# Patient Record
Sex: Female | Born: 1970 | Race: White | Hispanic: No | Marital: Married | Smoking: Never smoker
Health system: Southern US, Community
[De-identification: ages and names within clinical notes are randomized; demographics above are authoritative.]

## PROBLEM LIST (undated history)

## (undated) DIAGNOSIS — E119 Type 2 diabetes mellitus without complications: Secondary | ICD-10-CM

## (undated) HISTORY — DX: Type 2 diabetes mellitus without complications: E11.9

---

## 2001-03-27 ENCOUNTER — Other Ambulatory Visit: Admission: RE | Admit: 2001-03-27 | Discharge: 2001-03-27 | Payer: Self-pay | Admitting: Obstetrics and Gynecology

## 2002-03-30 ENCOUNTER — Other Ambulatory Visit: Admission: RE | Admit: 2002-03-30 | Discharge: 2002-03-30 | Payer: Self-pay | Admitting: Obstetrics and Gynecology

## 2002-08-25 ENCOUNTER — Encounter: Admission: RE | Admit: 2002-08-25 | Discharge: 2002-11-23 | Payer: Self-pay | Admitting: Obstetrics and Gynecology

## 2002-12-29 ENCOUNTER — Encounter: Payer: Self-pay | Admitting: Obstetrics and Gynecology

## 2002-12-29 ENCOUNTER — Ambulatory Visit (HOSPITAL_COMMUNITY): Admission: RE | Admit: 2002-12-29 | Discharge: 2002-12-29 | Payer: Self-pay | Admitting: Obstetrics and Gynecology

## 2003-03-11 ENCOUNTER — Inpatient Hospital Stay (HOSPITAL_COMMUNITY): Admission: AD | Admit: 2003-03-11 | Discharge: 2003-03-13 | Payer: Self-pay | Admitting: Obstetrics and Gynecology

## 2003-04-22 ENCOUNTER — Other Ambulatory Visit: Admission: RE | Admit: 2003-04-22 | Discharge: 2003-04-22 | Payer: Self-pay | Admitting: Obstetrics and Gynecology

## 2004-07-20 ENCOUNTER — Other Ambulatory Visit: Admission: RE | Admit: 2004-07-20 | Discharge: 2004-07-20 | Payer: Self-pay | Admitting: Obstetrics and Gynecology

## 2004-09-28 ENCOUNTER — Encounter: Admission: RE | Admit: 2004-09-28 | Discharge: 2004-09-28 | Payer: Self-pay | Admitting: Family Medicine

## 2005-09-14 ENCOUNTER — Other Ambulatory Visit: Admission: RE | Admit: 2005-09-14 | Discharge: 2005-09-14 | Payer: Self-pay | Admitting: Obstetrics and Gynecology

## 2006-10-21 ENCOUNTER — Ambulatory Visit (HOSPITAL_BASED_OUTPATIENT_CLINIC_OR_DEPARTMENT_OTHER): Admission: RE | Admit: 2006-10-21 | Discharge: 2006-10-21 | Payer: Self-pay | Admitting: Surgery

## 2012-07-02 ENCOUNTER — Other Ambulatory Visit: Payer: Self-pay | Admitting: Dermatology

## 2012-07-09 ENCOUNTER — Other Ambulatory Visit: Payer: Self-pay | Admitting: Obstetrics and Gynecology

## 2013-01-12 ENCOUNTER — Other Ambulatory Visit: Payer: Self-pay | Admitting: Dermatology

## 2013-07-23 ENCOUNTER — Other Ambulatory Visit: Payer: Self-pay | Admitting: Obstetrics and Gynecology

## 2014-03-04 ENCOUNTER — Other Ambulatory Visit: Payer: Self-pay | Admitting: Obstetrics and Gynecology

## 2014-03-04 DIAGNOSIS — N63 Unspecified lump in unspecified breast: Secondary | ICD-10-CM

## 2014-03-16 ENCOUNTER — Ambulatory Visit
Admission: RE | Admit: 2014-03-16 | Discharge: 2014-03-16 | Disposition: A | Discharge status: Home / Self Care | Payer: PRIVATE HEALTH INSURANCE | Source: Ambulatory Visit | Attending: Obstetrics and Gynecology | Admitting: Obstetrics and Gynecology

## 2014-03-16 ENCOUNTER — Other Ambulatory Visit: Payer: Self-pay | Admitting: Obstetrics and Gynecology

## 2014-03-16 DIAGNOSIS — N63 Unspecified lump in unspecified breast: Secondary | ICD-10-CM

## 2014-08-19 ENCOUNTER — Other Ambulatory Visit: Payer: Self-pay | Admitting: Obstetrics and Gynecology

## 2014-08-20 LAB — CYTOLOGY - PAP

## 2016-03-31 ENCOUNTER — Ambulatory Visit (INDEPENDENT_AMBULATORY_CARE_PROVIDER_SITE_OTHER): Payer: PRIVATE HEALTH INSURANCE | Admitting: Physician Assistant

## 2016-03-31 VITALS — BP 100/62 | HR 75 | Temp 97.9°F | Resp 16 | Ht 64.0 in | Wt 98.4 lb

## 2016-03-31 DIAGNOSIS — L309 Dermatitis, unspecified: Secondary | ICD-10-CM

## 2016-03-31 DIAGNOSIS — T7840XA Allergy, unspecified, initial encounter: Secondary | ICD-10-CM | POA: Diagnosis not present

## 2016-03-31 MED ORDER — PREDNISONE 10 MG (21) PO TBPK: ORAL_TABLET | ORAL | Status: AC

## 2016-03-31 MED ORDER — TRIAMCINOLONE ACETONIDE 0.1 % EX CREA: 1.0000 "application " | TOPICAL_CREAM | Freq: Two times a day (BID) | CUTANEOUS | Status: AC

## 2016-03-31 NOTE — Progress Notes (Signed)
[facility] [address] [place] [address] [phone]  Date:  03/31/2016   Name:  Brittany Francis   DOB:  [birth year]   MRN:  [id number]  PCP:  No primary care provider on file.    Chief Complaint: facial swelling and eye swelling'   History of Present Illness:  This is a 46 y.o. female with PMH IDDM who is presenting with anterior neck swelling and pruritus x 1 week. Yesterday she started getting some right eyelid swelling and pruritus as well. States this has happened before -- happened for a few days about 1 month ago but went away on own. States about 1 year ago she had some swelling/erythema on her neck and went to see dermatologist, Dr. Danella Deis, about it. Thought it was eczema but apparently no cream prescribed. This went away on own as well. No known allergies as far as she knows. Nothing new in the past week except that she is more stressed, she became tearful over this but did not want to discuss further. She has tried benadryl just yesterday and only helped some. She has not tried anything else.  Pt was diagnosed with diabetes in her 31s. 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 She is insulin dependent as of 3 years ago. She uses her novolog on sliding scale. Takes only 5 units lantus QHS. She states last A1C 5.7.  Review of Systems:  Review of Systems See HPI  There are no active problems to display for this patient.   Prior to Admission medications   Medication Sig Start Date End Date Taking? Authorizing Provider  insulin aspart (NOVOLOG) 100 UNIT/ML injection Inject into the skin 3 (three) times daily before meals.   Yes Historical Provider, MD  insulin glargine (LANTUS) 100 UNIT/ML injection Inject 5 Units into the skin at bedtime.   Yes Historical Provider, MD    Allergies  Allergen Reactions  . Codeine Nausea And Vomiting    History reviewed. No pertinent past surgical history.  Social History  Substance Use Topics  . Smoking status:  Never Smoker   . Smokeless tobacco: None  . Alcohol Use: 0.0 oz/week    0 Standard drinks or equivalent per week    Family History  Problem Relation Age of Onset  . Cancer Father   . Diabetes Father   . Cancer Maternal Grandmother   . Cancer Paternal Grandmother   . Diabetes Paternal Grandfather     Medication list has been reviewed and updated.  Physical Examination:  Physical Exam  Constitutional: She is oriented to person, place, and time. She appears well-developed and well-nourished. No distress.  HENT:  Head: Normocephalic and atraumatic.  Right Ear: Hearing, tympanic membrane, external ear and ear canal normal.  Left Ear: Hearing, tympanic membrane, external ear and ear canal normal.  Nose: Nose normal.  Mouth/Throat: Uvula is midline, oropharynx is clear and moist and mucous membranes are normal.  Eyes: Conjunctivae and EOM are normal. Pupils are equal, round, and reactive to light. Right eye exhibits no discharge. Left eye exhibits no discharge. No scleral icterus.  Right lateral upper and lower lids with erythema and swelling. There is dry scaling on upper lid. No tenderness.  Cardiovascular: Normal rate, regular rhythm, normal heart sounds and normal pulses.   No murmur heard. Pulmonary/Chest: Effort normal and breath sounds normal. No respiratory distress. She has no wheezes. She has no rhonchi. She has no rales.  Musculoskeletal: Normal range  of motion.  Lymphadenopathy:       Head (right side): No submental, no submandibular and no tonsillar adenopathy present.       Head (left side): No submental, no submandibular and no tonsillar adenopathy present.    She has no cervical adenopathy.       Right: No supraclavicular adenopathy present.       Left: No supraclavicular adenopathy present.  Neurological: She is alert and oriented to person, place, and time.  Skin: Skin is warm, dry and intact.  Anterior neck with 3x2 inch area of erythema and mild swelling. Skin is  dry with some scaling.  Psychiatric: She has a normal mood and affect. Her speech is normal and behavior is normal. Thought content normal.   BP 100/62 mmHg  Pulse 75  Temp(Src) 97.9 F (36.6 C) (Oral)  Resp 16  Ht 5\' 4"  (1.626 m)  Wt 98 lb 6.4 oz (44.634 kg)  BMI 16.88 kg/m2  SpO2 96%  LMP 03/25/2016  Assessment and Plan:  1. Dermatitis 2. Allergic reaction, initial encounter Dermatitis vs allergic reaction or combination of both. Prednisone taper - pt's last a1c 5.7. Should be fine on a 6 day taper. She will use novolog per sliding scale. triamcinoline bid on neck until resolve, for up to 2 weeks. vaseline on eye twice a day. Advised she keep a diary when this happens to try to identify a trigger. Return to see Dr. Danella DeisGruber if symptoms do not resolve on this regimen. - triamcinolone cream (KENALOG) 0.1 %; Apply 1 application topically 2 (two) times daily.  Dispense: 30 g; Refill: 0 - predniSONE (STERAPRED UNI-PAK 21 TAB) 10 MG (21) TBPK tablet; Take 6 tabs po on day 1, 5 tabs po on day 2, 4 tabs po on day 3, 3 tabs po on day 4, 2 tabs po on day 5, 1 tab po on day 6  Dispense: 21 tablet; Refill: 0   Roswell MinersNicole V. Dyke BrackettBush, PA-C, MHS [facility]  03/31/2016

## 2016-03-31 NOTE — Patient Instructions (Addendum)
Zyrtec daily Triamcinolone cream twice a day on neck. vaseline twice a day on eye. Prednisone taper as directed. Follow sliding scale with your novolog. Return in 1 week if symptoms are not improving or at any time if symptoms worsen    IF you received an x-ray today, you will receive an invoice from [facility]. Please contact [facility] at [phone] with questions or concerns regarding your invoice.   IF you received labwork today, you will receive an invoice from [facility]. Please contact [facility] at [phone] with questions or concerns regarding your invoice.   Our billing staff will not be able to assist you with questions regarding bills from these companies.  You will be contacted with the lab results as soon as they are available. The fastest way to get your results is to activate your My Chart account. Instructions are located on the last page of this paperwork. If you have not heard from us regarding the results in 2 weeks, please contact this office.

## 2017-10-21 ENCOUNTER — Other Ambulatory Visit: Payer: Self-pay | Admitting: Obstetrics and Gynecology

## 2017-10-21 DIAGNOSIS — N6310 Unspecified lump in the right breast, unspecified quadrant: Secondary | ICD-10-CM

## 2017-10-25 ENCOUNTER — Ambulatory Visit
Admission: RE | Admit: 2017-10-25 | Discharge: 2017-10-25 | Disposition: A | Discharge status: Home / Self Care | Payer: PRIVATE HEALTH INSURANCE | Source: Ambulatory Visit | Attending: Obstetrics and Gynecology | Admitting: Obstetrics and Gynecology

## 2017-10-25 DIAGNOSIS — N6310 Unspecified lump in the right breast, unspecified quadrant: Secondary | ICD-10-CM

## 2018-09-28 IMAGING — MG 2D DIGITAL DIAGNOSTIC UNILATERAL RIGHT MAMMOGRAM WITH CAD AND AD
6 of 9 series · 6 of 21 positions shown · non-contrast
Comparison: Previous exam(s).

CLINICAL DATA: Patient describes a palpable lump within the right
breast, 2 o'clock axis region, for 1-2 weeks, recently resolved.
Family history of breast cancer.

EXAM:
2D DIGITAL DIAGNOSTIC RIGHT MAMMOGRAM WITH CAD AND ADJUNCT TOMO
ULTRASOUND RIGHT BREAST

[R MLO]
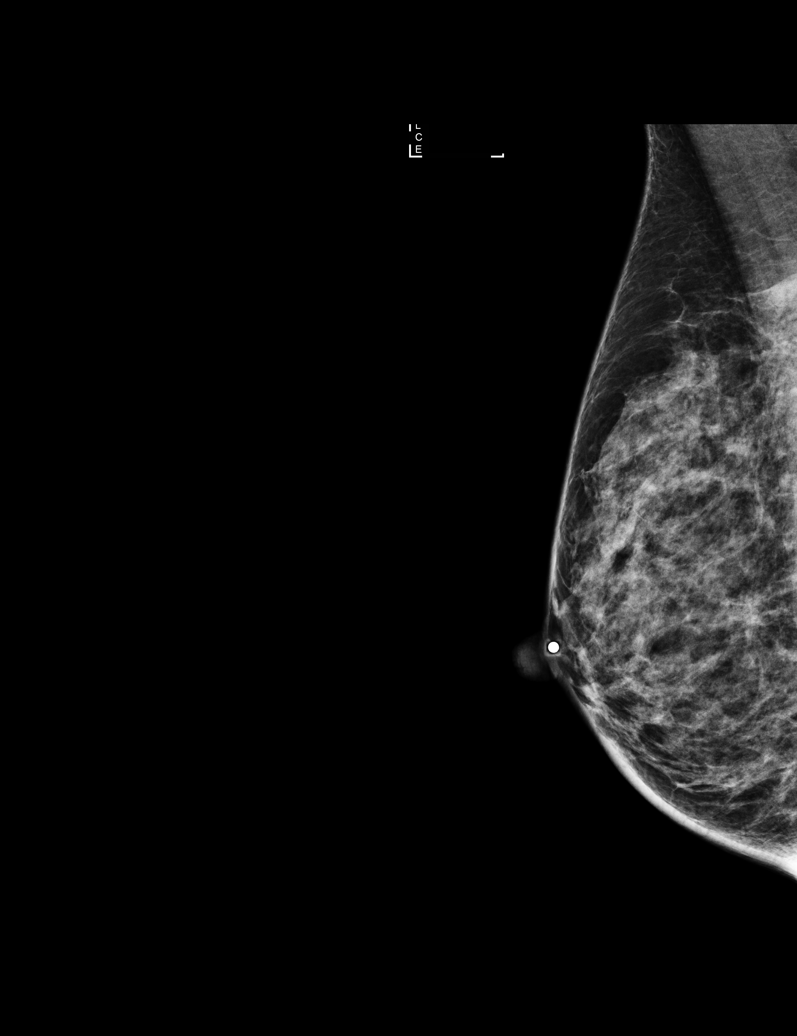

[R MLO synth-2D]
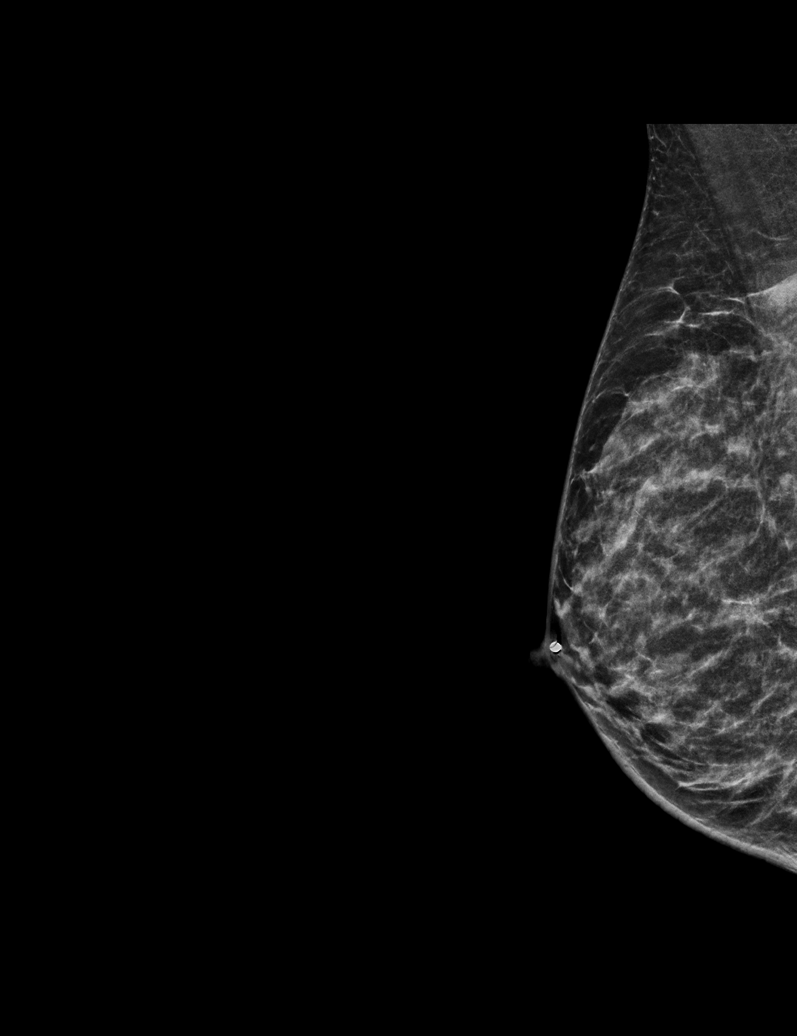

[R TAN synth-2D]
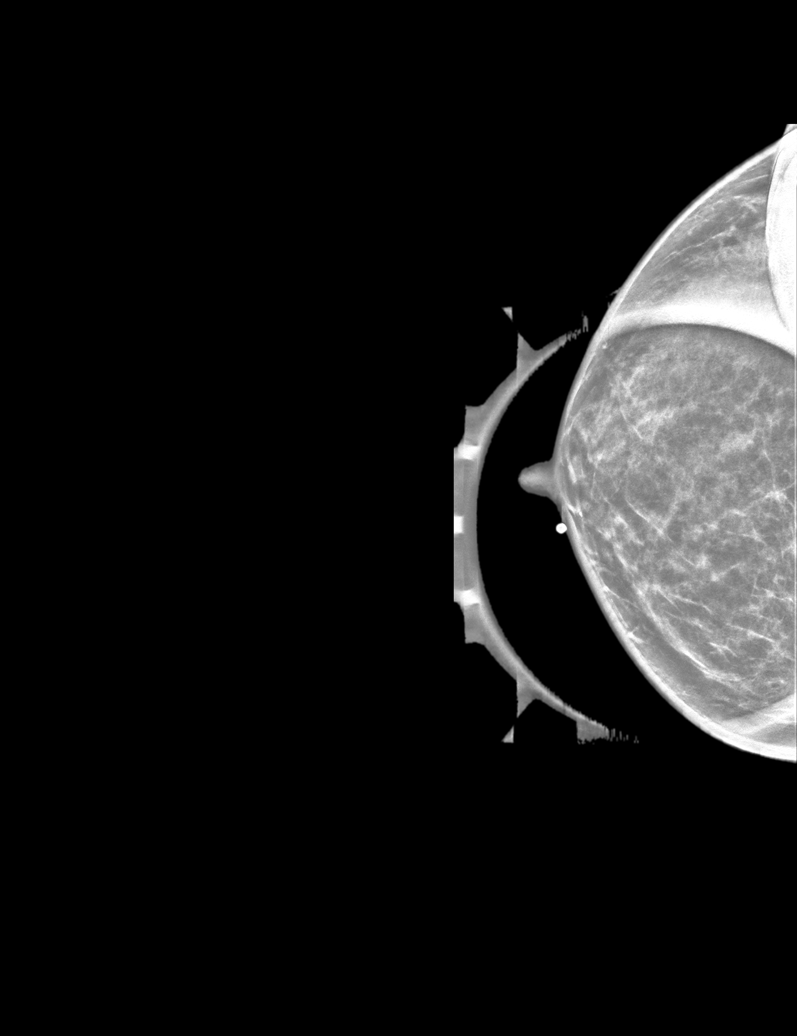

[R CC]
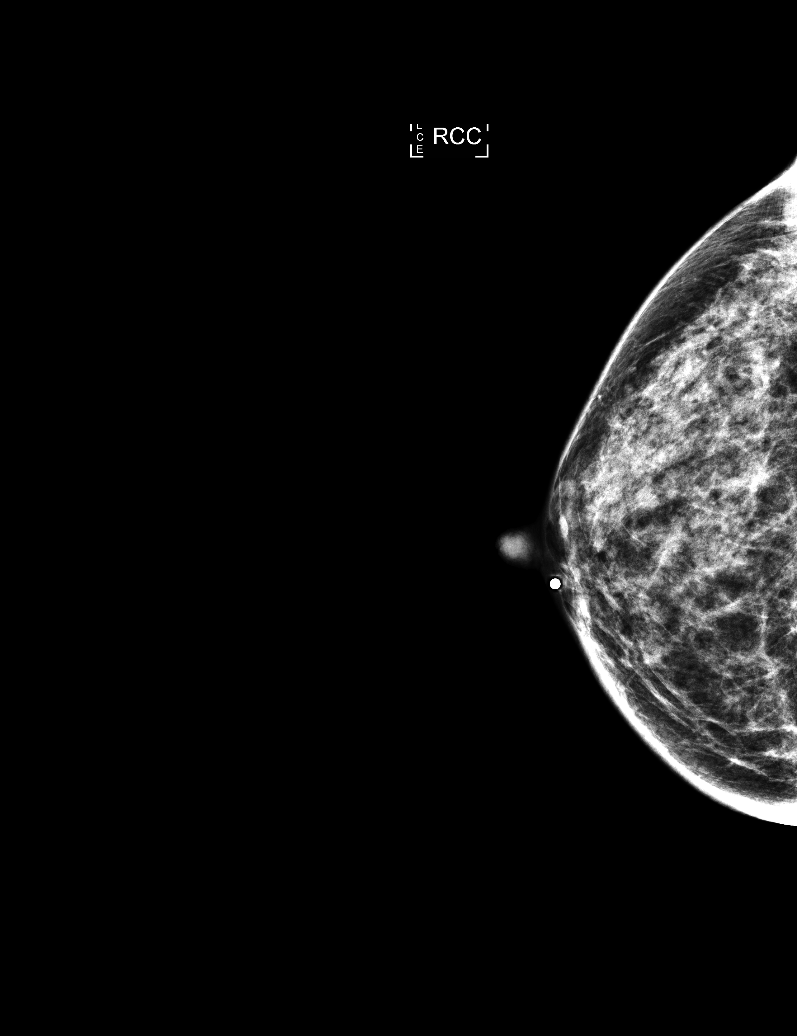

[R CC synth-2D]
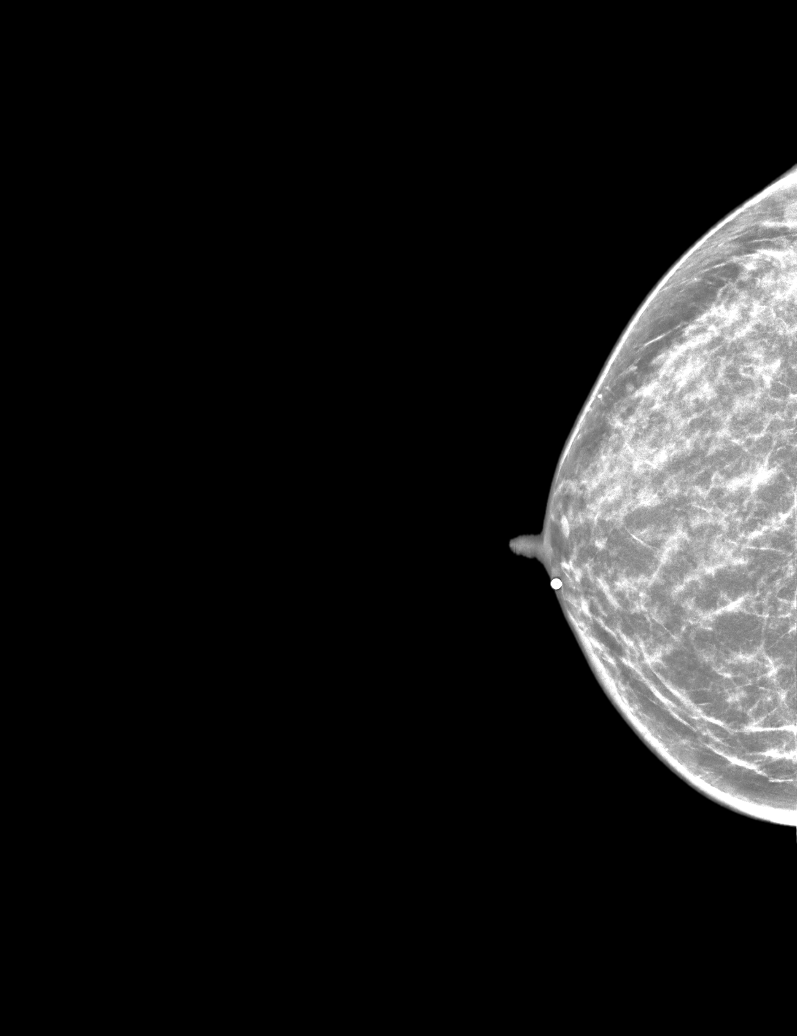

[R TAN]
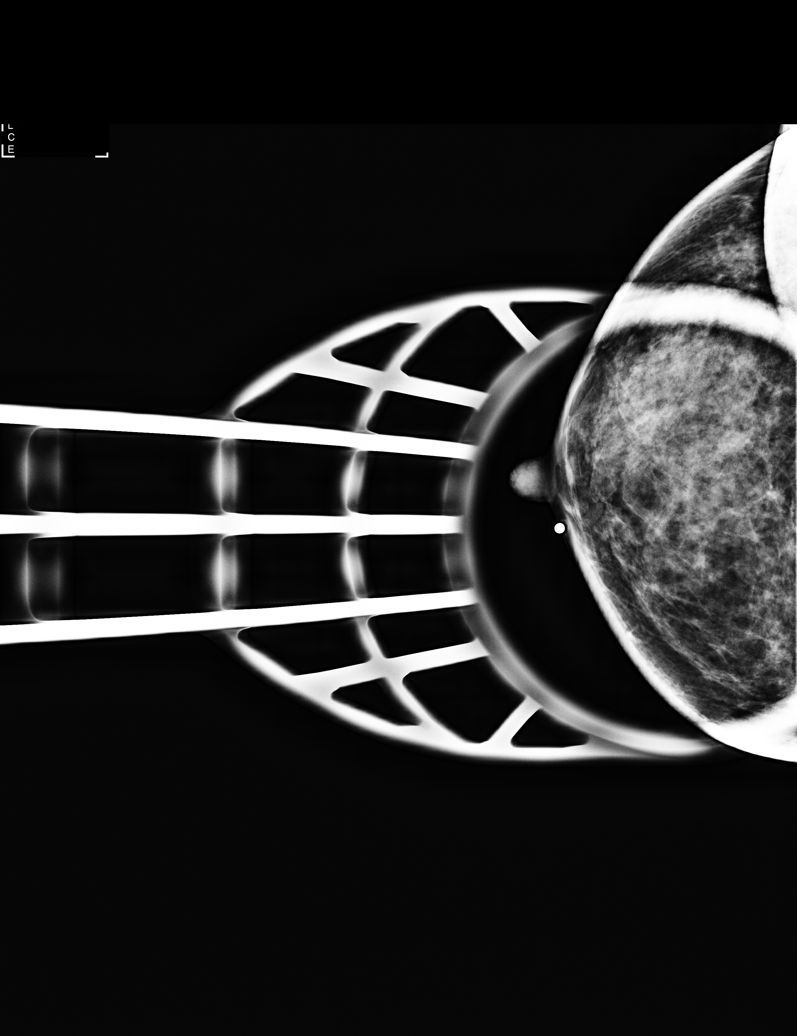

[6 of 21 positions shown; findings below may reference images not displayed]

ACR Breast Density Category c: The breast tissue is heterogeneously
dense, which may obscure small masses.
FINDINGS: Right breast 2D CC and MLO views were obtained today, with
additional 3D tomosynthesis, and with additional spot compression
view of the slightly inner right breast corresponding to the area of
clinical concern, with overlying skin marker in place.

There are no new dominant masses, suspicious calcifications or
secondary signs of malignancy identified within the right breast.
Specifically, there is no mammographic abnormality within the
slightly inner right breast corresponding to the area of clinical
concern.

Mammographic images were processed with CAD.

On physical exam, no palpable mass is identified today.

Targeted ultrasound is performed, evaluating the slightly inner
right breast as directed by the patient, showing only normal
fibroglandular tissues and fat lobules. No suspicious solid or
cystic mass.
IMPRESSION: No evidence of malignancy within the right breast. Patient states
that the recently identified lump in the right breast resolved prior
to today's exam, suspect resolved cyst.

RECOMMENDATION:
1. Annual screening mammograms. Next bilateral screening mammogram
will be due in Sunday December, 2017 corresponding to patient's routine
left breast screening mammogram schedule.
2. The patient was instructed to return sooner if the palpable lump
in the right breast returns, or if a new palpable abnormality is
identified in either breast.

I have discussed the findings and recommendations with the patient.
Results were also provided in writing at the conclusion of the
visit. If applicable, a reminder letter will be sent to the patient
regarding the next appointment.

BI-RADS CATEGORY  1: Negative.

## 2019-11-07 ENCOUNTER — Other Ambulatory Visit: Payer: Self-pay

## 2019-11-07 DIAGNOSIS — Z20822 Contact with and (suspected) exposure to covid-19: Secondary | ICD-10-CM

## 2019-11-08 LAB — NOVEL CORONAVIRUS, NAA: SARS-CoV-2, NAA: NOT DETECTED

## 2020-01-07 ENCOUNTER — Ambulatory Visit: Payer: PRIVATE HEALTH INSURANCE | Attending: Internal Medicine

## 2020-01-07 DIAGNOSIS — Z20822 Contact with and (suspected) exposure to covid-19: Secondary | ICD-10-CM

## 2020-01-09 LAB — NOVEL CORONAVIRUS, NAA: SARS-CoV-2, NAA: NOT DETECTED

## 2020-03-31 DIAGNOSIS — E039 Hypothyroidism, unspecified: Secondary | ICD-10-CM | POA: Diagnosis not present

## 2020-03-31 DIAGNOSIS — E785 Hyperlipidemia, unspecified: Secondary | ICD-10-CM | POA: Diagnosis not present

## 2020-03-31 DIAGNOSIS — E109 Type 1 diabetes mellitus without complications: Secondary | ICD-10-CM | POA: Diagnosis not present

## 2020-04-07 DIAGNOSIS — E039 Hypothyroidism, unspecified: Secondary | ICD-10-CM | POA: Diagnosis not present

## 2020-04-07 DIAGNOSIS — E109 Type 1 diabetes mellitus without complications: Secondary | ICD-10-CM | POA: Diagnosis not present

## 2020-04-07 DIAGNOSIS — E785 Hyperlipidemia, unspecified: Secondary | ICD-10-CM | POA: Diagnosis not present

## 2020-04-07 DIAGNOSIS — E162 Hypoglycemia, unspecified: Secondary | ICD-10-CM | POA: Diagnosis not present

## 2020-04-26 DIAGNOSIS — N390 Urinary tract infection, site not specified: Secondary | ICD-10-CM | POA: Diagnosis not present

## 2020-08-02 DIAGNOSIS — E785 Hyperlipidemia, unspecified: Secondary | ICD-10-CM | POA: Diagnosis not present

## 2020-08-11 DIAGNOSIS — E109 Type 1 diabetes mellitus without complications: Secondary | ICD-10-CM | POA: Diagnosis not present

## 2020-08-11 DIAGNOSIS — E039 Hypothyroidism, unspecified: Secondary | ICD-10-CM | POA: Diagnosis not present

## 2020-08-11 DIAGNOSIS — E162 Hypoglycemia, unspecified: Secondary | ICD-10-CM | POA: Diagnosis not present

## 2020-08-11 DIAGNOSIS — E785 Hyperlipidemia, unspecified: Secondary | ICD-10-CM | POA: Diagnosis not present

## 2020-10-10 DIAGNOSIS — R21 Rash and other nonspecific skin eruption: Secondary | ICD-10-CM | POA: Diagnosis not present

## 2020-11-01 DIAGNOSIS — F4323 Adjustment disorder with mixed anxiety and depressed mood: Secondary | ICD-10-CM | POA: Diagnosis not present

## 2020-11-29 DIAGNOSIS — F4323 Adjustment disorder with mixed anxiety and depressed mood: Secondary | ICD-10-CM | POA: Diagnosis not present

## 2021-01-03 DIAGNOSIS — F4323 Adjustment disorder with mixed anxiety and depressed mood: Secondary | ICD-10-CM | POA: Diagnosis not present

## 2021-01-24 DIAGNOSIS — F4323 Adjustment disorder with mixed anxiety and depressed mood: Secondary | ICD-10-CM | POA: Diagnosis not present

## 2021-01-31 DIAGNOSIS — Z20822 Contact with and (suspected) exposure to covid-19: Secondary | ICD-10-CM | POA: Diagnosis not present

## 2021-02-09 DIAGNOSIS — E109 Type 1 diabetes mellitus without complications: Secondary | ICD-10-CM | POA: Diagnosis not present

## 2021-02-09 DIAGNOSIS — E785 Hyperlipidemia, unspecified: Secondary | ICD-10-CM | POA: Diagnosis not present

## 2021-02-09 DIAGNOSIS — E162 Hypoglycemia, unspecified: Secondary | ICD-10-CM | POA: Diagnosis not present

## 2021-02-09 DIAGNOSIS — E039 Hypothyroidism, unspecified: Secondary | ICD-10-CM | POA: Diagnosis not present

## 2021-02-14 DIAGNOSIS — F4323 Adjustment disorder with mixed anxiety and depressed mood: Secondary | ICD-10-CM | POA: Diagnosis not present

## 2021-03-01 DIAGNOSIS — Z1231 Encounter for screening mammogram for malignant neoplasm of breast: Secondary | ICD-10-CM | POA: Diagnosis not present

## 2021-03-01 DIAGNOSIS — Z681 Body mass index (BMI) 19 or less, adult: Secondary | ICD-10-CM | POA: Diagnosis not present

## 2021-03-01 DIAGNOSIS — Z01419 Encounter for gynecological examination (general) (routine) without abnormal findings: Secondary | ICD-10-CM | POA: Diagnosis not present

## 2021-03-01 DIAGNOSIS — Z113 Encounter for screening for infections with a predominantly sexual mode of transmission: Secondary | ICD-10-CM | POA: Diagnosis not present

## 2021-03-06 ENCOUNTER — Other Ambulatory Visit: Payer: Self-pay | Admitting: Obstetrics and Gynecology

## 2021-03-06 DIAGNOSIS — R928 Other abnormal and inconclusive findings on diagnostic imaging of breast: Secondary | ICD-10-CM

## 2021-03-21 ENCOUNTER — Other Ambulatory Visit: Payer: Self-pay

## 2021-03-21 ENCOUNTER — Ambulatory Visit
Admission: RE | Admit: 2021-03-21 | Discharge: 2021-03-21 | Disposition: A | Discharge status: Home / Self Care | Payer: PRIVATE HEALTH INSURANCE | Source: Ambulatory Visit | Attending: Obstetrics and Gynecology | Admitting: Obstetrics and Gynecology

## 2021-03-21 DIAGNOSIS — R928 Other abnormal and inconclusive findings on diagnostic imaging of breast: Secondary | ICD-10-CM

## 2021-03-21 DIAGNOSIS — N6489 Other specified disorders of breast: Secondary | ICD-10-CM | POA: Diagnosis not present

## 2021-08-03 DIAGNOSIS — E039 Hypothyroidism, unspecified: Secondary | ICD-10-CM | POA: Diagnosis not present

## 2021-08-03 DIAGNOSIS — E109 Type 1 diabetes mellitus without complications: Secondary | ICD-10-CM | POA: Diagnosis not present

## 2021-08-03 DIAGNOSIS — E785 Hyperlipidemia, unspecified: Secondary | ICD-10-CM | POA: Diagnosis not present

## 2021-08-10 DIAGNOSIS — E162 Hypoglycemia, unspecified: Secondary | ICD-10-CM | POA: Diagnosis not present

## 2021-08-10 DIAGNOSIS — E109 Type 1 diabetes mellitus without complications: Secondary | ICD-10-CM | POA: Diagnosis not present

## 2021-08-10 DIAGNOSIS — E039 Hypothyroidism, unspecified: Secondary | ICD-10-CM | POA: Diagnosis not present

## 2021-08-10 DIAGNOSIS — E785 Hyperlipidemia, unspecified: Secondary | ICD-10-CM | POA: Diagnosis not present

## 2022-02-12 DIAGNOSIS — E162 Hypoglycemia, unspecified: Secondary | ICD-10-CM | POA: Diagnosis not present

## 2022-02-12 DIAGNOSIS — E039 Hypothyroidism, unspecified: Secondary | ICD-10-CM | POA: Diagnosis not present

## 2022-02-12 DIAGNOSIS — E109 Type 1 diabetes mellitus without complications: Secondary | ICD-10-CM | POA: Diagnosis not present

## 2022-02-12 DIAGNOSIS — E785 Hyperlipidemia, unspecified: Secondary | ICD-10-CM | POA: Diagnosis not present

## 2022-02-22 IMAGING — US US BREAST*R* LIMITED INC AXILLA
1 series · 7 of 7 positions shown · non-contrast
Comparison: Previous exam(s).

CLINICAL DATA: Patient recalled from screening for right breast
mass.

EXAM:
DIGITAL DIAGNOSTIC UNILATERAL RIGHT MAMMOGRAM WITH TOMOSYNTHESIS AND
CAD; ULTRASOUND RIGHT BREAST LIMITED
TECHNIQUE: Right digital diagnostic mammography and breast tomosynthesis was
performed. The images were evaluated with computer-aided detection.;
Targeted ultrasound examination of the right breast was performed

[Series 1: us breast*right* limited inc axilla · 0.05mm/px · 7 of 7 slices shown]
[im 1/7]
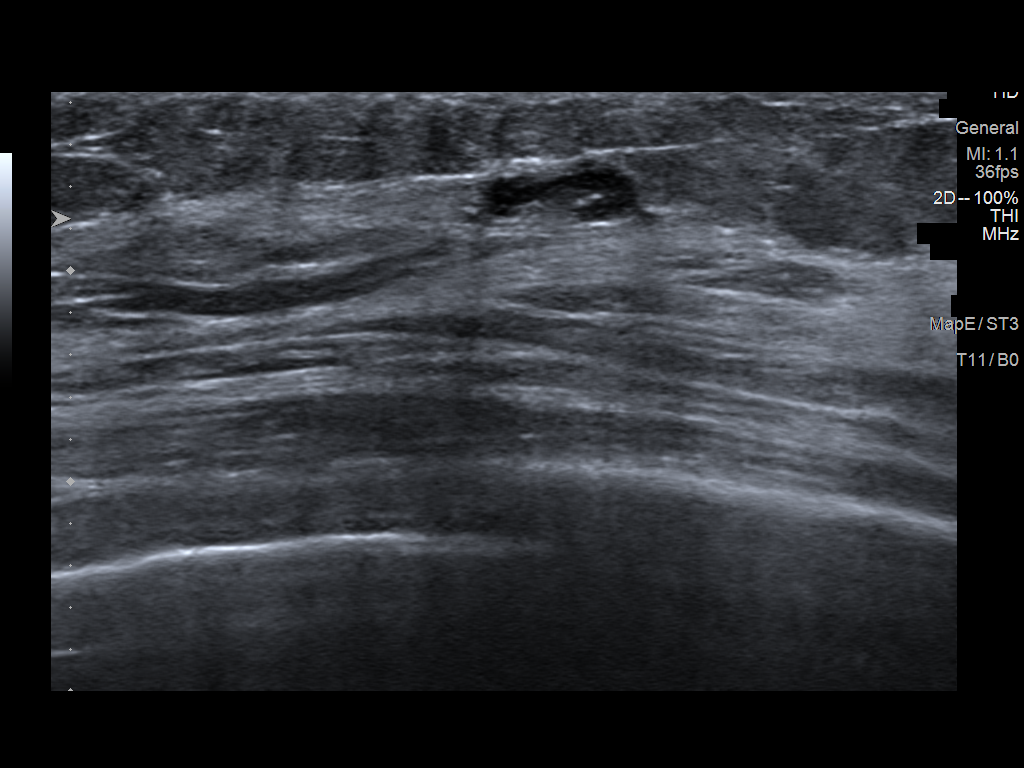
[im 2/7]
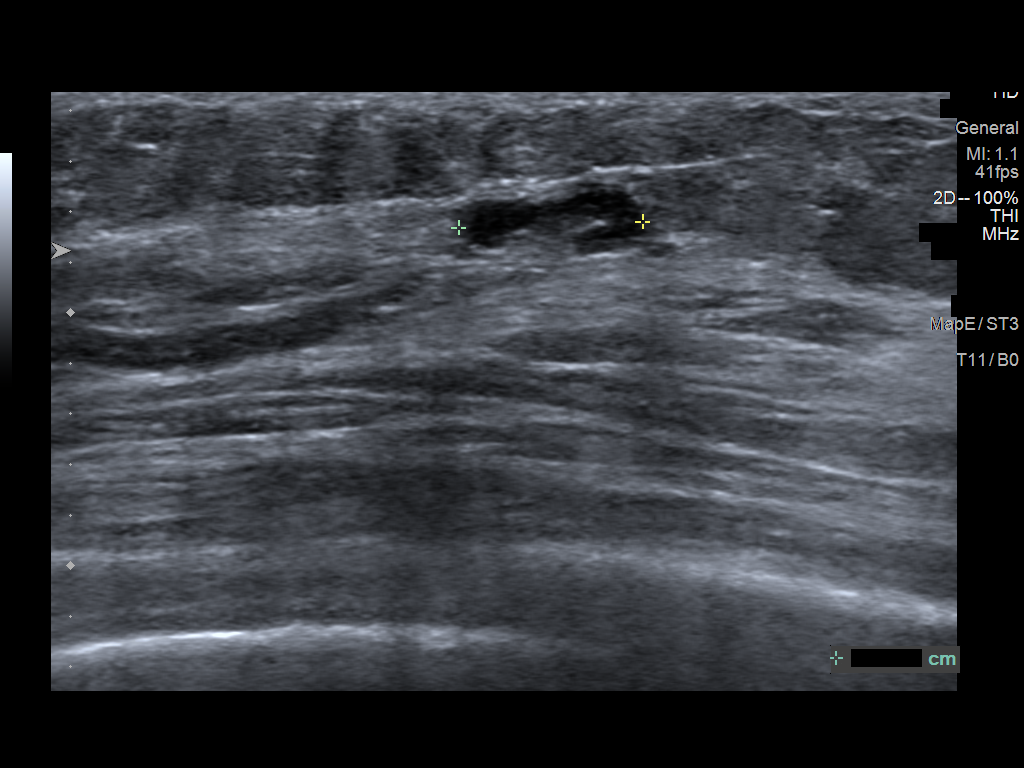
[im 3/7]
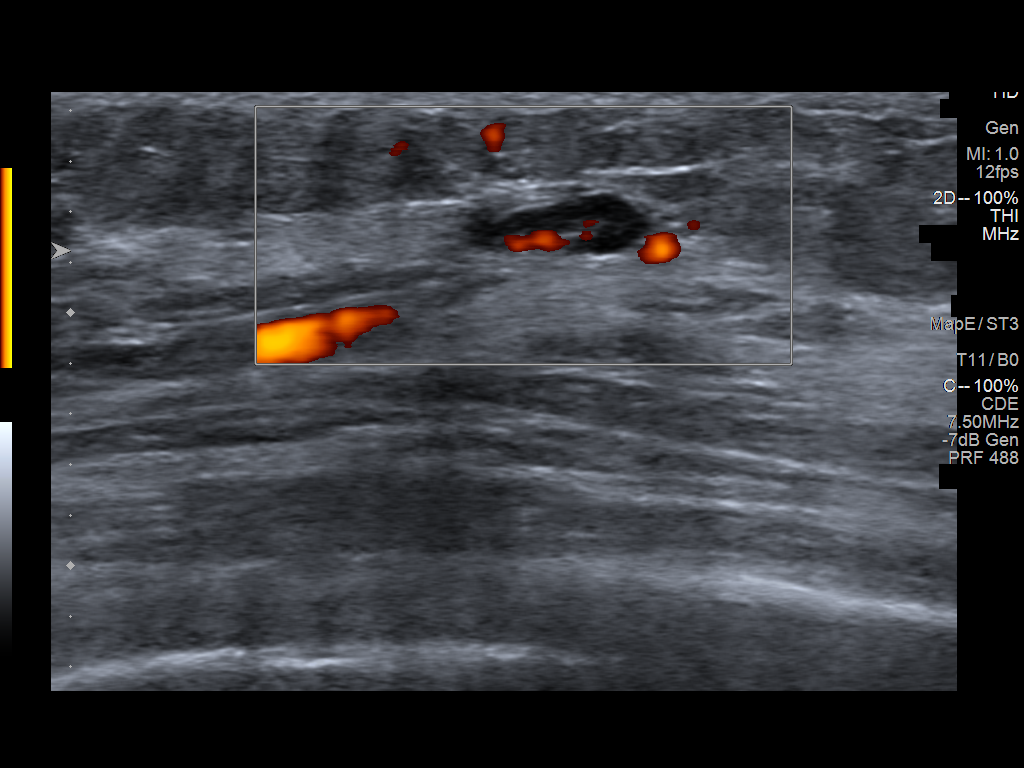
[im 4/7]
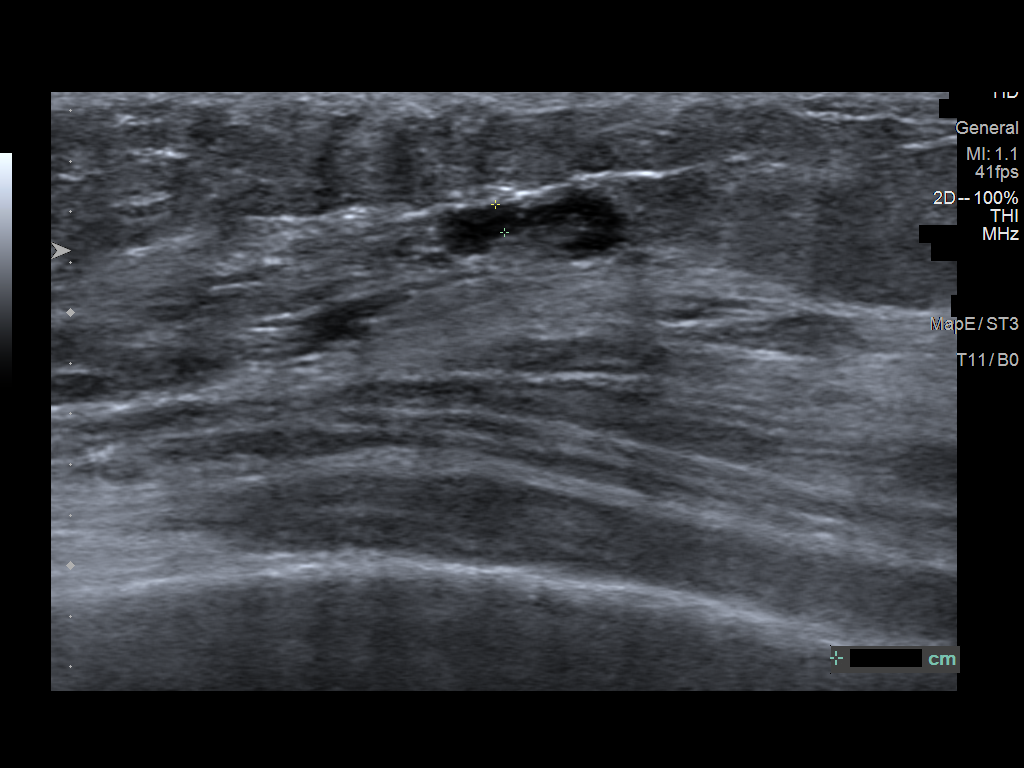
[im 5/7]
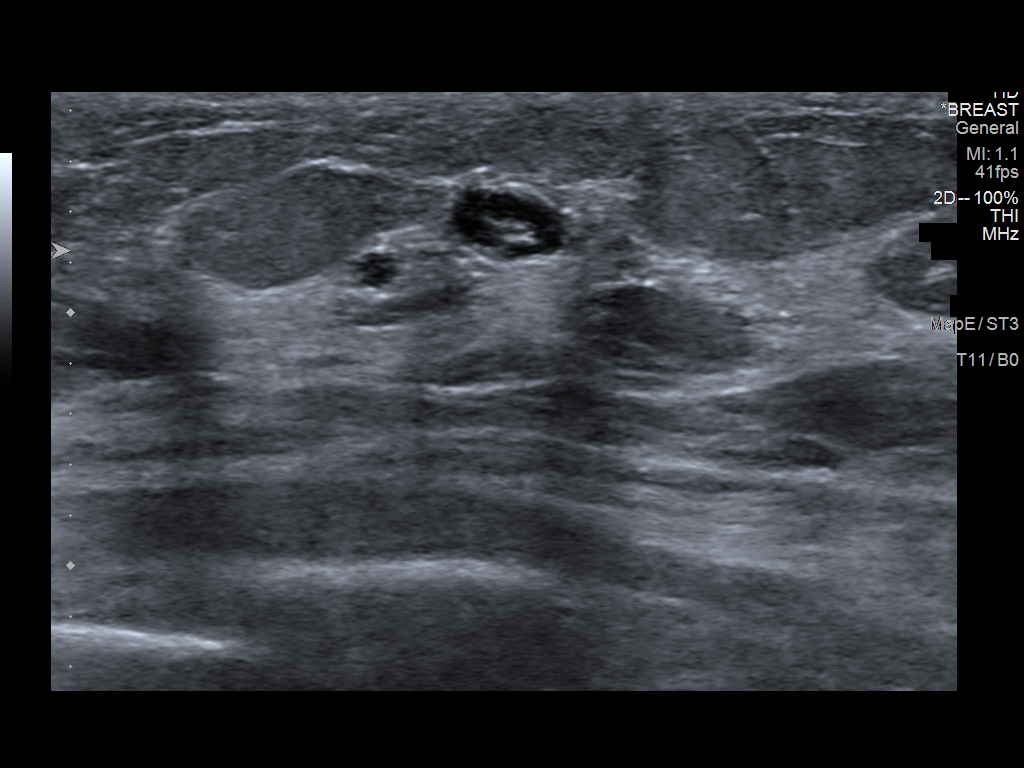
[im 6/7]
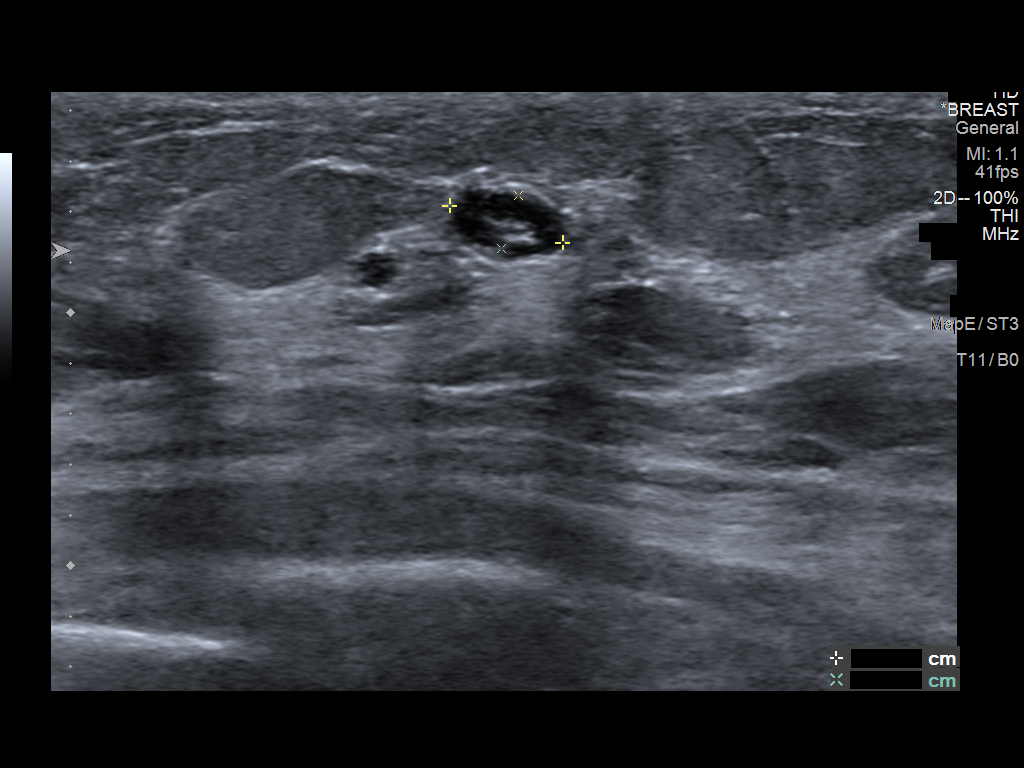
[im 7/7]
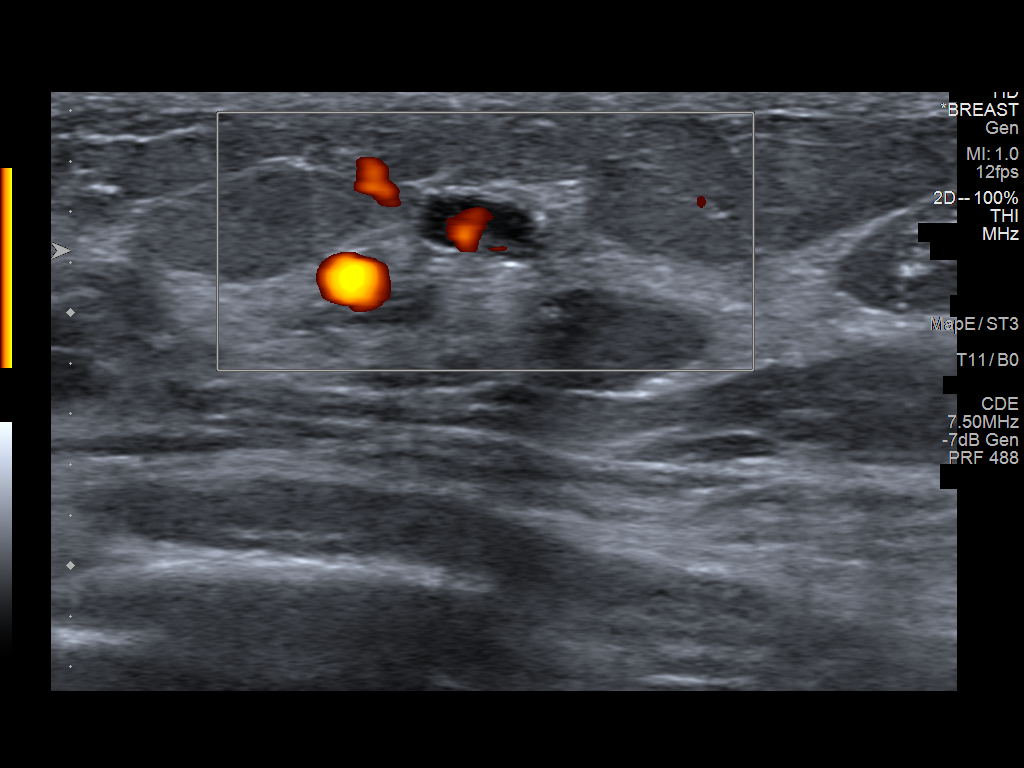

[7 of 7 positions shown; findings below may reference images not displayed]

ACR Breast Density Category c: The breast tissue is heterogeneously
dense, which may obscure small masses.
FINDINGS: Within the superior slightly lateral right breast posterior depth
there is a persistent oval circumscribed mass, further evaluated
with additional imaging.

Targeted ultrasound is performed, showing a normal appearing 7 mm
intramammary lymph node right breast 10:30 o'clock 10 cm from nipple
corresponding with the mammographically identified mass.
IMPRESSION: No mammographic evidence for malignancy. Intramammary lymph node
right breast.

RECOMMENDATION:
Screening mammogram in one year.(Code:LG-W-BSG)

I have discussed the findings and recommendations with the patient.
If applicable, a reminder letter will be sent to the patient
regarding the next appointment.

BI-RADS CATEGORY  2: Benign.

## 2022-05-07 ENCOUNTER — Encounter (HOSPITAL_BASED_OUTPATIENT_CLINIC_OR_DEPARTMENT_OTHER): Payer: Self-pay | Admitting: Obstetrics and Gynecology

## 2022-05-07 ENCOUNTER — Other Ambulatory Visit: Payer: Self-pay

## 2022-05-07 ENCOUNTER — Emergency Department (HOSPITAL_BASED_OUTPATIENT_CLINIC_OR_DEPARTMENT_OTHER)
Admission: EM | Admit: 2022-05-07 | Discharge: 2022-05-07 | Disposition: A | Discharge status: Home / Self Care | Payer: BC Managed Care – PPO | Attending: Emergency Medicine | Admitting: Emergency Medicine

## 2022-05-07 ENCOUNTER — Emergency Department (HOSPITAL_BASED_OUTPATIENT_CLINIC_OR_DEPARTMENT_OTHER): Payer: BC Managed Care – PPO

## 2022-05-07 DIAGNOSIS — K625 Hemorrhage of anus and rectum: Secondary | ICD-10-CM | POA: Insufficient documentation

## 2022-05-07 DIAGNOSIS — R197 Diarrhea, unspecified: Secondary | ICD-10-CM

## 2022-05-07 DIAGNOSIS — K6389 Other specified diseases of intestine: Secondary | ICD-10-CM | POA: Diagnosis not present

## 2022-05-07 DIAGNOSIS — R109 Unspecified abdominal pain: Secondary | ICD-10-CM | POA: Diagnosis not present

## 2022-05-07 DIAGNOSIS — Z794 Long term (current) use of insulin: Secondary | ICD-10-CM | POA: Insufficient documentation

## 2022-05-07 DIAGNOSIS — K529 Noninfective gastroenteritis and colitis, unspecified: Secondary | ICD-10-CM | POA: Diagnosis not present

## 2022-05-07 DIAGNOSIS — E119 Type 2 diabetes mellitus without complications: Secondary | ICD-10-CM | POA: Diagnosis not present

## 2022-05-07 DIAGNOSIS — R000 Tachycardia, unspecified: Secondary | ICD-10-CM | POA: Diagnosis not present

## 2022-05-07 DIAGNOSIS — I700 Atherosclerosis of aorta: Secondary | ICD-10-CM | POA: Diagnosis not present

## 2022-05-07 LAB — COMPREHENSIVE METABOLIC PANEL
ALT: 10 U/L (ref 0–44)
AST: 12 U/L — ABNORMAL LOW (ref 15–41)
Albumin: 4.3 g/dL (ref 3.5–5.0)
Alkaline Phosphatase: 50 U/L (ref 38–126)
Anion gap: 8 (ref 5–15)
BUN: 11 mg/dL (ref 6–20)
CO2: 26 mmol/L (ref 22–32)
Calcium: 9.3 mg/dL (ref 8.9–10.3)
Chloride: 103 mmol/L (ref 98–111)
Creatinine, Ser: 0.61 mg/dL (ref 0.44–1.00)
GFR, Estimated: >60 mL/min (ref >60)
Glucose, Bld: 151 mg/dL — ABNORMAL HIGH (ref 70–99)
Potassium: 3.6 mmol/L (ref 3.5–5.1)
Sodium: 137 mmol/L (ref 135–145)
Total Bilirubin: 0.5 mg/dL (ref 0.3–1.2)
Total Protein: 7 g/dL (ref 6.5–8.1)

## 2022-05-07 LAB — URINALYSIS, ROUTINE W REFLEX MICROSCOPIC
Bilirubin Urine: NEGATIVE
Glucose, UA: 500 mg/dL — AB
Ketones, ur: NEGATIVE mg/dL
Leukocytes,Ua: NEGATIVE
Nitrite: NEGATIVE
Protein, ur: NEGATIVE mg/dL
Specific Gravity, Urine: 1.015 (ref 1.005–1.030)
pH: 5.5 (ref 5.0–8.0)

## 2022-05-07 LAB — CBC
HCT: 41.1 % (ref 36.0–46.0)
Hemoglobin: 13.8 g/dL (ref 12.0–15.0)
MCH: 32.1 pg (ref 26.0–34.0)
MCHC: 33.6 g/dL (ref 30.0–36.0)
MCV: 95.6 fL (ref 80.0–100.0)
Platelets: 218 10*3/uL (ref 150–400)
RBC: 4.3 MIL/uL (ref 3.87–5.11)
RDW: 12.9 % (ref 11.5–15.5)
WBC: 8 10*3/uL (ref 4.0–10.5)
nRBC: 0 % (ref 0.0–0.2)

## 2022-05-07 LAB — PREGNANCY, URINE: Preg Test, Ur: NEGATIVE

## 2022-05-07 LAB — LIPASE, BLOOD: Lipase: <10 U/L — ABNORMAL LOW (ref 11–51)

## 2022-05-07 LAB — PROTIME-INR
INR: 0.9 (ref 0.8–1.2)
Prothrombin Time: 12.2 seconds (ref 11.4–15.2)

## 2022-05-07 LAB — OCCULT BLOOD X 1 CARD TO LAB, STOOL: Fecal Occult Bld: POSITIVE — AB

## 2022-05-07 MED ORDER — SODIUM CHLORIDE 0.9 % IV BOLUS
1000.0000 mL | Freq: Once | INTRAVENOUS | Status: AC
  Administered 2022-05-07: 1000 mL via INTRAVENOUS

## 2022-05-07 MED ORDER — IOHEXOL 300 MG/ML  SOLN
80.0000 mL | Freq: Once | INTRAMUSCULAR | Status: AC | PRN
  Administered 2022-05-07: 75 mL via INTRAVENOUS

## 2022-05-07 NOTE — ED Provider Notes (Signed)
[facility] EMERGENCY DEPT Provider Note   CSN: [id number] Arrival date & time: 05/07/22  1603     History  Chief Complaint  Patient presents with   Abdominal Pain    Brittany Francis is a 52 y.o. female.   Abdominal Pain Associated symptoms: nausea   Associated symptoms: no chills, no diarrhea, no dysuria, no fever and no vomiting    52 year old female presents emergency department with severe abdominal pain, nausea and bright red blood per rectum.  Pain began around 530 this morning waking patient up.  She describes the pain as sharp and stabbing, and says that it lasts about 1 to 2 minutes per episode.  She says she had about 10-12 episodes of bloody bowel movements today.  Blood is described as pooling the bottom of the toilet after bowel movement.  Pain onset just prior to having a bowel movement and persists for about a minute afterwards.  She has had no history of similar symptoms.  She is not currently on a blood thinner.  Upon evaluation of the patient she is not currently endorsing symptoms; she also states that she is not actively bleeding to her knowledge.  She denies fever, chills, night sweats, chest pain, shortness of breath, vomiting, urinary/vaginal symptoms, recent travel.  She has a past medical history significant for diabetes.  Home Medications Prior to Admission medications   Medication Sig Start Date End Date Taking? Authorizing Provider  insulin aspart (NOVOLOG) 100 UNIT/ML injection Inject into the skin 3 (three) times daily before meals.    [provider]  insulin glargine (LANTUS) 100 UNIT/ML injection Inject 5 Units into the skin at bedtime.    [provider]  predniSONE (STERAPRED UNI-PAK 21 TAB) 10 MG (21) TBPK tablet Take 6 tabs po on day 1, 5 tabs po on day 2, 4 tabs po on day 3, 3 tabs po on day 4, 2 tabs po on day 5, 1 tab po on day 6 03/31/16   Dorna Leitz, PA-C  triamcinolone cream (KENALOG) 0.1 % Apply 1 application  topically 2 (two) times daily. 03/31/16   Dorna Leitz, PA-C      Allergies    Codeine    Review of Systems   Review of Systems  Constitutional:  Negative for chills and fever.  Gastrointestinal:  Positive for abdominal pain, blood in stool and nausea. Negative for diarrhea and vomiting.  Genitourinary:  Negative for dysuria and urgency.  Musculoskeletal:  Negative for arthralgias and myalgias.  Neurological:  Negative for dizziness, seizures and syncope.  Hematological:  Does not bruise/bleed easily.   Physical Exam Updated Vital Signs BP 112/86   Pulse 86   Temp 98.2 F (36.8 C)   Resp 16   Ht  (1.626 m)   Wt 53.5 kg   LMP 05/18/2020 (Approximate) Comment: Ablation  SpO2 99%   BMI 20.25 kg/m  Physical Exam Vitals and nursing note reviewed.  Constitutional:      General: She is not in acute distress.    Appearance: Normal appearance. She is well-developed. She is not ill-appearing, toxic-appearing or diaphoretic.  HENT:     Head: Normocephalic and atraumatic.     Mouth/Throat:     Mouth: Mucous membranes are moist.     Pharynx: Oropharynx is clear.  Eyes:     Extraocular Movements: Extraocular movements intact.     Pupils: Pupils are equal, round, and reactive to light.  Cardiovascular:     Rate and Rhythm:  Regular rhythm. Tachycardia present.     Heart sounds: Normal heart sounds. No murmur heard. Pulmonary:     Effort: Pulmonary effort is normal.     Breath sounds: Normal breath sounds.  Abdominal:     General: Abdomen is flat. Bowel sounds are normal. There is no distension.     Palpations: Abdomen is soft. There is no mass.     Tenderness: There is abdominal tenderness in the left upper quadrant and left lower quadrant. There is no guarding or rebound.  Genitourinary:    Rectum: Normal. No mass or tenderness.     Comments: No observable blood or discharge upon initial inspection of rectum.  No external hemorrhoids noted.  Upon digital rectal exam no  fissures, masses, areas of tenderness noted.  Patient did elicit discomfort with exam.  No immediate complications with exam.  Verbal consent was given before.  Chaperone present throughout.  Hemoccult obtained and sent to lab for results which was positive. Skin:    General: Skin is warm and dry.     Capillary Refill: Capillary refill takes less than 2 seconds.  Neurological:     General: No focal deficit present.     Mental Status: She is alert and oriented to person, place, and time.  Psychiatric:        Mood and Affect: Mood normal.        Behavior: Behavior normal.    ED Results / Procedures / Treatments   Labs (all labs ordered are listed, but only abnormal results are displayed) Labs Reviewed  LIPASE, BLOOD - Abnormal; Notable for the following components:      Result Value   Lipase <10 (*)    All other components within normal limits  COMPREHENSIVE METABOLIC PANEL - Abnormal; Notable for the following components:   Glucose, Bld 151 (*)    AST 12 (*)    All other components within normal limits  URINALYSIS, ROUTINE W REFLEX MICROSCOPIC - Abnormal; Notable for the following components:   Glucose, UA 500 (*)    Hgb urine dipstick TRACE (*)    All other components within normal limits  OCCULT BLOOD X 1 CARD TO LAB, STOOL - Abnormal; Notable for the following components:   Fecal Occult Bld POSITIVE (*)    All other components within normal limits  STOOL CULTURE  CBC  PREGNANCY, URINE  PROTIME-INR    EKG None  Radiology CT Abdomen Pelvis W Contrast  Result Date: 05/07/2022 CLINICAL DATA:  Abdominal pain, acute, nonlocalized abdominal pain periumbilical. Hematochezia EXAM: CT ABDOMEN AND PELVIS WITH CONTRAST TECHNIQUE: Multidetector CT imaging of the abdomen and pelvis was performed using the standard protocol following bolus administration of intravenous contrast. RADIATION DOSE REDUCTION: This exam was performed according to the departmental dose-optimization program  which includes automated exposure control, adjustment of the mA and/or kV according to patient size and/or use of iterative reconstruction technique. CONTRAST:  93mL OMNIPAQUE IOHEXOL 300 MG/ML  SOLN COMPARISON:  None Available. FINDINGS: Lower chest: No acute abnormality. Hepatobiliary: No focal liver abnormality is seen. No gallstones, gallbladder wall thickening, or biliary dilatation. Pancreas: Unremarkable Spleen: Unremarkable Adrenals/Urinary Tract: Adrenal glands are unremarkable. Kidneys are normal, without renal calculi, focal lesion, or hydronephrosis. Bladder is unremarkable. Stomach/Bowel: There is long segment circumferential wall thickening and moderate pericolonic inflammatory stranding involving the descending colon and proximal sigmoid colon suggesting changes of underlying infectious, inflammatory, or potentially ischemic colitis. The central mesenteric arterial and venous vasculature, however, appears patent. There is no evidence  of obstruction or perforation. The stomach, small bowel, and large bowel are otherwise unremarkable. Appendix normal. No free intraperitoneal gas or fluid. Vascular/Lymphatic: Aortic atherosclerosis. No enlarged abdominal or pelvic lymph nodes. Reproductive: The uterus is unremarkable. Simple cysts are seen within the ovaries bilaterally measuring up to 2.9 cm on the left and 2.6 cm on the right likely representing follicular cyst in the premenopausal patient. No follow-up imaging is recommended. Other: Tiny fat containing umbilical hernia.  Rectum unremarkable. Musculoskeletal: No acute bone abnormality. No lytic or blastic bone lesion. IMPRESSION: Long segment inflammatory changes involving the descending and proximal sigmoid colon in keeping with an infectious, inflammatory, or, less likely, ischemic colitis. No evidence of obstruction or perforation. Electronically Signed   By: Helyn NumbersAshesh  Parikh M.D.   On: 05/07/2022 22:06    Procedures Procedures    Medications  Ordered in ED Medications  sodium chloride 0.9 % bolus 1,000 mL (0 mLs Intravenous Stopped 05/07/22 1838)  iohexol (OMNIPAQUE) 300 MG/ML solution 80 mL (75 mLs Intravenous Contrast Given 05/07/22 2156)    ED Course/ Medical Decision Making/ A&P                           Medical Decision Making Amount and/or Complexity of Data Reviewed Labs: ordered.   This patient presents to the ED for concern of abdominal pain and blood in stool, this involves an extensive number of treatment options, and is a complaint that carries with it a high risk of complications and morbidity.  The differential diagnosis includes diverticulitis, colitis, appendicitis, diverticulosis, pancreatitis, cholecystitis, ovarian torsion, peritonitis, mesenteric ischemia   Co morbidities that complicate the patient evaluation  Diabetes   Lab Tests:  I Ordered, and personally interpreted labs.  The pertinent results include: UA significant for glucose of 500 and trace hemoglobin, Hemoccult card positive for occult blood.  Stool culture pending upon discharge.   Imaging Studies ordered:  I ordered imaging studies including CT abdomen pelvis with contrast I independently visualized and interpreted imaging which showed long segment inflammatory changes involving the descending and proximal sigmoid colon in keeping with an infectious, inflammatory, or less likely, ischemic colitis.  No evidence of obstruction or perforation. I agree with the radiologist interpretation    Cardiac Monitoring: / EKG:  The patient was maintained on a cardiac monitor.  I personally viewed and interpreted the cardiac monitored which showed an underlying rhythm of: Normal sinus rhythm   Consultations Obtained:  N/a   Problem List / ED Course / Critical interventions / Medication management  Colitis I ordered medication including 1 L of normal saline Reevaluation of the patient after these medicines showed that the patient improved I  have reviewed the patients home medicines and have made adjustments as needed   Social Determinants of Health:  Denies tobacco/illicit drug use   Test / Admission - Considered:  Colitis Vital signs negative for tachycardia initially, but tachycardia resolved on its own.  Otherwise vitals within normal range and stable throughout visit Suspected colitis confirmed with CT abdomen pelvis.  Acute nature of patient's symptoms likely secondary to infectious nature.  Stool culture was obtained.  Patient was advised to follow MyChart for test results.  Patient has a appointment with her primary care provider tomorrow.  She has appoint with her primary care provider tomorrow.  She is agreeable to inform the provider of her ED visit as well as pending stool culture result and request further treatment if necessary. Worrisome signs  and symptoms were discussed with the patient and the patient knowledge understanding to return to the emergency department if noticed.  Patient was stable upon discharge.         Final Clinical Impression(s) / ED Diagnoses Final diagnoses:  Diarrhea, unspecified type  Colitis    Rx / DC Orders ED Discharge Orders     None         Peter Garter, [place] 05/08/22 0033    Cheryll Cockayne, MD 05/18/22 2242

## 2022-05-07 NOTE — ED Notes (Signed)
Patient transported to CT 

## 2022-05-07 NOTE — ED Triage Notes (Signed)
Patient reports to the ER for blood in her stool and intermittent severe abdominal pain. Denies ever having a colonoscopy. States has no hx of diverticulitis. Reports pain starting in the peri-umbilical region. Endorses x2 hours of nausea this morning. Endorses feeling clammy.  ?

## 2022-05-07 NOTE — Discharge Instructions (Addendum)
Look for results of stool culture in your MyChart app.  Follow-up with your primary care physician tomorrow.  I will include helpful diet to follow for abdominal discomfort.  Please do not hesitate to return to the emergency department if symptoms do not get better or worsen. ?

## 2022-05-08 DIAGNOSIS — K921 Melena: Secondary | ICD-10-CM | POA: Diagnosis not present

## 2022-05-08 DIAGNOSIS — K529 Noninfective gastroenteritis and colitis, unspecified: Secondary | ICD-10-CM | POA: Diagnosis not present

## 2022-05-08 DIAGNOSIS — E785 Hyperlipidemia, unspecified: Secondary | ICD-10-CM | POA: Diagnosis not present

## 2022-05-08 DIAGNOSIS — Z1211 Encounter for screening for malignant neoplasm of colon: Secondary | ICD-10-CM | POA: Diagnosis not present

## 2022-05-12 LAB — STOOL CULTURE: E coli, Shiga toxin Assay: NEGATIVE

## 2022-05-12 LAB — STOOL CULTURE REFLEX - CMPCXR

## 2022-05-12 LAB — STOOL CULTURE REFLEX - RSASHR

## 2022-05-17 DIAGNOSIS — R933 Abnormal findings on diagnostic imaging of other parts of digestive tract: Secondary | ICD-10-CM | POA: Diagnosis not present

## 2022-05-17 DIAGNOSIS — K429 Umbilical hernia without obstruction or gangrene: Secondary | ICD-10-CM | POA: Diagnosis not present

## 2022-05-17 DIAGNOSIS — Z1211 Encounter for screening for malignant neoplasm of colon: Secondary | ICD-10-CM | POA: Diagnosis not present

## 2022-05-17 DIAGNOSIS — K625 Hemorrhage of anus and rectum: Secondary | ICD-10-CM | POA: Diagnosis not present

## 2022-06-04 DIAGNOSIS — K625 Hemorrhage of anus and rectum: Secondary | ICD-10-CM | POA: Diagnosis not present

## 2022-06-04 DIAGNOSIS — K5730 Diverticulosis of large intestine without perforation or abscess without bleeding: Secondary | ICD-10-CM | POA: Diagnosis not present

## 2022-06-04 DIAGNOSIS — K648 Other hemorrhoids: Secondary | ICD-10-CM | POA: Diagnosis not present

## 2022-06-04 DIAGNOSIS — R933 Abnormal findings on diagnostic imaging of other parts of digestive tract: Secondary | ICD-10-CM | POA: Diagnosis not present

## 2022-06-04 DIAGNOSIS — K635 Polyp of colon: Secondary | ICD-10-CM | POA: Diagnosis not present

## 2022-06-04 DIAGNOSIS — D125 Benign neoplasm of sigmoid colon: Secondary | ICD-10-CM | POA: Diagnosis not present

## 2022-06-04 DIAGNOSIS — Z1211 Encounter for screening for malignant neoplasm of colon: Secondary | ICD-10-CM | POA: Diagnosis not present

## 2022-07-24 DIAGNOSIS — L0292 Furuncle, unspecified: Secondary | ICD-10-CM | POA: Diagnosis not present

## 2022-07-24 DIAGNOSIS — L821 Other seborrheic keratosis: Secondary | ICD-10-CM | POA: Diagnosis not present

## 2022-07-24 DIAGNOSIS — D2372 Other benign neoplasm of skin of left lower limb, including hip: Secondary | ICD-10-CM | POA: Diagnosis not present

## 2022-07-24 DIAGNOSIS — L814 Other melanin hyperpigmentation: Secondary | ICD-10-CM | POA: Diagnosis not present

## 2022-08-13 DIAGNOSIS — E039 Hypothyroidism, unspecified: Secondary | ICD-10-CM | POA: Diagnosis not present

## 2022-08-13 DIAGNOSIS — E785 Hyperlipidemia, unspecified: Secondary | ICD-10-CM | POA: Diagnosis not present

## 2022-08-13 DIAGNOSIS — E109 Type 1 diabetes mellitus without complications: Secondary | ICD-10-CM | POA: Diagnosis not present

## 2022-08-20 DIAGNOSIS — E162 Hypoglycemia, unspecified: Secondary | ICD-10-CM | POA: Diagnosis not present

## 2022-08-20 DIAGNOSIS — E039 Hypothyroidism, unspecified: Secondary | ICD-10-CM | POA: Diagnosis not present

## 2022-08-20 DIAGNOSIS — E785 Hyperlipidemia, unspecified: Secondary | ICD-10-CM | POA: Diagnosis not present

## 2022-08-20 DIAGNOSIS — E109 Type 1 diabetes mellitus without complications: Secondary | ICD-10-CM | POA: Diagnosis not present

## 2022-10-29 DIAGNOSIS — Z113 Encounter for screening for infections with a predominantly sexual mode of transmission: Secondary | ICD-10-CM | POA: Diagnosis not present

## 2022-10-29 DIAGNOSIS — Z01419 Encounter for gynecological examination (general) (routine) without abnormal findings: Secondary | ICD-10-CM | POA: Diagnosis not present

## 2022-10-29 DIAGNOSIS — Z1231 Encounter for screening mammogram for malignant neoplasm of breast: Secondary | ICD-10-CM | POA: Diagnosis not present

## 2022-10-29 DIAGNOSIS — Z681 Body mass index (BMI) 19 or less, adult: Secondary | ICD-10-CM | POA: Diagnosis not present

## 2022-10-29 DIAGNOSIS — Z13820 Encounter for screening for osteoporosis: Secondary | ICD-10-CM | POA: Diagnosis not present

## 2022-11-29 DIAGNOSIS — B179 Acute viral hepatitis, unspecified: Secondary | ICD-10-CM | POA: Diagnosis not present

## 2023-02-20 DIAGNOSIS — E039 Hypothyroidism, unspecified: Secondary | ICD-10-CM | POA: Diagnosis not present

## 2023-02-20 DIAGNOSIS — E785 Hyperlipidemia, unspecified: Secondary | ICD-10-CM | POA: Diagnosis not present

## 2023-02-20 DIAGNOSIS — E109 Type 1 diabetes mellitus without complications: Secondary | ICD-10-CM | POA: Diagnosis not present

## 2023-02-27 DIAGNOSIS — E039 Hypothyroidism, unspecified: Secondary | ICD-10-CM | POA: Diagnosis not present

## 2023-02-27 DIAGNOSIS — E109 Type 1 diabetes mellitus without complications: Secondary | ICD-10-CM | POA: Diagnosis not present

## 2023-02-27 DIAGNOSIS — Z9641 Presence of insulin pump (external) (internal): Secondary | ICD-10-CM | POA: Diagnosis not present

## 2023-02-27 DIAGNOSIS — E785 Hyperlipidemia, unspecified: Secondary | ICD-10-CM | POA: Diagnosis not present

## 2023-05-09 DIAGNOSIS — L820 Inflamed seborrheic keratosis: Secondary | ICD-10-CM | POA: Diagnosis not present

## 2023-05-09 DIAGNOSIS — D485 Neoplasm of uncertain behavior of skin: Secondary | ICD-10-CM | POA: Diagnosis not present

## 2023-07-15 DIAGNOSIS — Z23 Encounter for immunization: Secondary | ICD-10-CM | POA: Diagnosis not present

## 2023-07-25 DIAGNOSIS — L814 Other melanin hyperpigmentation: Secondary | ICD-10-CM | POA: Diagnosis not present

## 2023-07-25 DIAGNOSIS — L821 Other seborrheic keratosis: Secondary | ICD-10-CM | POA: Diagnosis not present

## 2023-07-25 DIAGNOSIS — D2371 Other benign neoplasm of skin of right lower limb, including hip: Secondary | ICD-10-CM | POA: Diagnosis not present

## 2023-07-25 DIAGNOSIS — M6740 Ganglion, unspecified site: Secondary | ICD-10-CM | POA: Diagnosis not present

## 2023-08-29 DIAGNOSIS — Z9641 Presence of insulin pump (external) (internal): Secondary | ICD-10-CM | POA: Diagnosis not present

## 2023-08-29 DIAGNOSIS — E039 Hypothyroidism, unspecified: Secondary | ICD-10-CM | POA: Diagnosis not present

## 2023-08-29 DIAGNOSIS — E109 Type 1 diabetes mellitus without complications: Secondary | ICD-10-CM | POA: Diagnosis not present

## 2023-08-29 DIAGNOSIS — E785 Hyperlipidemia, unspecified: Secondary | ICD-10-CM | POA: Diagnosis not present

## 2023-09-23 DIAGNOSIS — R0989 Other specified symptoms and signs involving the circulatory and respiratory systems: Secondary | ICD-10-CM | POA: Diagnosis not present

## 2023-09-23 DIAGNOSIS — R051 Acute cough: Secondary | ICD-10-CM | POA: Diagnosis not present

## 2024-01-16 DIAGNOSIS — Z124 Encounter for screening for malignant neoplasm of cervix: Secondary | ICD-10-CM | POA: Diagnosis not present

## 2024-01-16 DIAGNOSIS — Z01419 Encounter for gynecological examination (general) (routine) without abnormal findings: Secondary | ICD-10-CM | POA: Diagnosis not present

## 2024-01-16 DIAGNOSIS — Z1231 Encounter for screening mammogram for malignant neoplasm of breast: Secondary | ICD-10-CM | POA: Diagnosis not present

## 2024-01-16 DIAGNOSIS — Z6820 Body mass index (BMI) 20.0-20.9, adult: Secondary | ICD-10-CM | POA: Diagnosis not present

## 2024-02-24 DIAGNOSIS — E109 Type 1 diabetes mellitus without complications: Secondary | ICD-10-CM | POA: Diagnosis not present

## 2024-02-24 DIAGNOSIS — E039 Hypothyroidism, unspecified: Secondary | ICD-10-CM | POA: Diagnosis not present

## 2024-02-24 DIAGNOSIS — E785 Hyperlipidemia, unspecified: Secondary | ICD-10-CM | POA: Diagnosis not present

## 2024-03-02 DIAGNOSIS — Z9641 Presence of insulin pump (external) (internal): Secondary | ICD-10-CM | POA: Diagnosis not present

## 2024-03-02 DIAGNOSIS — E785 Hyperlipidemia, unspecified: Secondary | ICD-10-CM | POA: Diagnosis not present

## 2024-03-02 DIAGNOSIS — E109 Type 1 diabetes mellitus without complications: Secondary | ICD-10-CM | POA: Diagnosis not present

## 2024-03-02 DIAGNOSIS — E039 Hypothyroidism, unspecified: Secondary | ICD-10-CM | POA: Diagnosis not present

## 2024-04-24 DIAGNOSIS — H524 Presbyopia: Secondary | ICD-10-CM | POA: Diagnosis not present

## 2024-04-24 DIAGNOSIS — E109 Type 1 diabetes mellitus without complications: Secondary | ICD-10-CM | POA: Diagnosis not present

## 2024-04-24 DIAGNOSIS — D3131 Benign neoplasm of right choroid: Secondary | ICD-10-CM | POA: Diagnosis not present

## 2024-04-24 DIAGNOSIS — H5203 Hypermetropia, bilateral: Secondary | ICD-10-CM | POA: Diagnosis not present

## 2024-05-04 DIAGNOSIS — E039 Hypothyroidism, unspecified: Secondary | ICD-10-CM | POA: Diagnosis not present

## 2024-07-28 DIAGNOSIS — L570 Actinic keratosis: Secondary | ICD-10-CM | POA: Diagnosis not present

## 2024-07-28 DIAGNOSIS — L918 Other hypertrophic disorders of the skin: Secondary | ICD-10-CM | POA: Diagnosis not present

## 2024-07-28 DIAGNOSIS — D485 Neoplasm of uncertain behavior of skin: Secondary | ICD-10-CM | POA: Diagnosis not present

## 2024-07-28 DIAGNOSIS — L814 Other melanin hyperpigmentation: Secondary | ICD-10-CM | POA: Diagnosis not present

## 2024-07-28 DIAGNOSIS — L821 Other seborrheic keratosis: Secondary | ICD-10-CM | POA: Diagnosis not present

## 2024-08-12 DIAGNOSIS — H938X1 Other specified disorders of right ear: Secondary | ICD-10-CM | POA: Diagnosis not present

## 2024-08-12 DIAGNOSIS — J3081 Allergic rhinitis due to animal (cat) (dog) hair and dander: Secondary | ICD-10-CM | POA: Diagnosis not present

## 2024-08-12 DIAGNOSIS — J0120 Acute ethmoidal sinusitis, unspecified: Secondary | ICD-10-CM | POA: Diagnosis not present

## 2024-09-02 DIAGNOSIS — E785 Hyperlipidemia, unspecified: Secondary | ICD-10-CM | POA: Diagnosis not present

## 2024-09-02 DIAGNOSIS — E109 Type 1 diabetes mellitus without complications: Secondary | ICD-10-CM | POA: Diagnosis not present

## 2024-09-02 DIAGNOSIS — E039 Hypothyroidism, unspecified: Secondary | ICD-10-CM | POA: Diagnosis not present

## 2024-09-03 DIAGNOSIS — F4320 Adjustment disorder, unspecified: Secondary | ICD-10-CM | POA: Diagnosis not present

## 2024-09-09 ENCOUNTER — Other Ambulatory Visit (HOSPITAL_BASED_OUTPATIENT_CLINIC_OR_DEPARTMENT_OTHER): Payer: Self-pay | Admitting: Endocrinology

## 2024-09-09 DIAGNOSIS — E785 Hyperlipidemia, unspecified: Secondary | ICD-10-CM | POA: Diagnosis not present

## 2024-09-09 DIAGNOSIS — E039 Hypothyroidism, unspecified: Secondary | ICD-10-CM | POA: Diagnosis not present

## 2024-09-09 DIAGNOSIS — Z9641 Presence of insulin pump (external) (internal): Secondary | ICD-10-CM | POA: Diagnosis not present

## 2024-09-09 DIAGNOSIS — E109 Type 1 diabetes mellitus without complications: Secondary | ICD-10-CM | POA: Diagnosis not present

## 2024-09-16 ENCOUNTER — Ambulatory Visit (HOSPITAL_BASED_OUTPATIENT_CLINIC_OR_DEPARTMENT_OTHER)
Admission: RE | Admit: 2024-09-16 | Discharge: 2024-09-16 | Disposition: A | Discharge status: Home / Self Care | Payer: Self-pay | Source: Ambulatory Visit | Attending: Endocrinology | Admitting: Endocrinology

## 2024-09-16 DIAGNOSIS — E785 Hyperlipidemia, unspecified: Secondary | ICD-10-CM | POA: Insufficient documentation

## 2024-09-29 DIAGNOSIS — Z872 Personal history of diseases of the skin and subcutaneous tissue: Secondary | ICD-10-CM | POA: Diagnosis not present

## 2024-09-29 DIAGNOSIS — L570 Actinic keratosis: Secondary | ICD-10-CM | POA: Diagnosis not present

## 2024-09-29 DIAGNOSIS — D485 Neoplasm of uncertain behavior of skin: Secondary | ICD-10-CM | POA: Diagnosis not present

## 2024-09-29 DIAGNOSIS — L905 Scar conditions and fibrosis of skin: Secondary | ICD-10-CM | POA: Diagnosis not present

## 2024-10-01 DIAGNOSIS — F4320 Adjustment disorder, unspecified: Secondary | ICD-10-CM | POA: Diagnosis not present

## 2024-10-27 DIAGNOSIS — D23122 Other benign neoplasm of skin of left lower eyelid, including canthus: Secondary | ICD-10-CM | POA: Diagnosis not present

## 2024-10-30 DIAGNOSIS — Z13820 Encounter for screening for osteoporosis: Secondary | ICD-10-CM | POA: Diagnosis not present

## 2024-12-02 ENCOUNTER — Other Ambulatory Visit: Payer: Self-pay | Admitting: Medical Genetics

## 2025-01-14 ENCOUNTER — Other Ambulatory Visit: Payer: Self-pay

## 2025-01-14 DIAGNOSIS — Z006 Encounter for examination for normal comparison and control in clinical research program: Secondary | ICD-10-CM

## 2025-01-22 LAB — GENECONNECT MOLECULAR SCREEN

## 2025-01-26 ENCOUNTER — Telehealth: Payer: Self-pay | Admitting: Medical Genetics

## 2025-01-26 DIAGNOSIS — Z006 Encounter for examination for normal comparison and control in clinical research program: Secondary | ICD-10-CM

## 2025-01-27 NOTE — Telephone Encounter (Signed)
 [place] [facility]  01/27/2025  10:49 AM  Confirmed I was speaking with Brittany Francis [phone] by using name and DOB. Informed participant the reason for this call is to follow-up on a recent sample the participant provided at one of the [facility] lab locations. Informed participant the test was not able to be completed with this sample and apologized for the inconvenience. Participant was requested to provide a new sample at one of our participating labs at no cost so that participant can continue participation and receive test results. Informed participant they do not need to be fasting and if there are other samples that need to be drawn, they can be done at the same visit. Participant has not had a blood transfusion or blood product in the last 30 days. Participant agreed to provide another sample. Participant was provided the [employer] program website to learn why this may have happened. Participant was thanked for their time and continued support of the above study.    Jordyn Pennstrom, BS [place]  [facility] Clinical Research Specialist II Direct Dial: [phone]  Fax: [phone]

## 2025-02-11 ENCOUNTER — Other Ambulatory Visit: Payer: Self-pay
